# Patient Record
Sex: Female | Born: 1960 | Race: Asian | Hispanic: No | Marital: Single | Smoking: Never smoker
Health system: Southern US, Community
[De-identification: ages and names within clinical notes are randomized; demographics above are authoritative.]

## PROBLEM LIST (undated history)

## (undated) DIAGNOSIS — K759 Inflammatory liver disease, unspecified: Secondary | ICD-10-CM

## (undated) HISTORY — PX: DIAGNOSTIC LAPAROSCOPY: SUR761

---

## 2002-11-10 HISTORY — PX: LAPAROSCOPIC SALPINGOOPHERECTOMY: SUR795

## 2004-08-21 ENCOUNTER — Emergency Department: Payer: Self-pay | Admitting: Emergency Medicine

## 2004-09-06 ENCOUNTER — Ambulatory Visit: Payer: Self-pay | Admitting: Obstetrics and Gynecology

## 2004-09-22 ENCOUNTER — Emergency Department: Payer: Self-pay | Admitting: Emergency Medicine

## 2004-11-10 ENCOUNTER — Inpatient Hospital Stay: Payer: Self-pay | Admitting: Unknown Physician Specialty

## 2005-10-10 ENCOUNTER — Emergency Department: Payer: Self-pay | Admitting: Emergency Medicine

## 2005-12-17 ENCOUNTER — Ambulatory Visit: Payer: Self-pay | Admitting: Obstetrics and Gynecology

## 2006-01-19 ENCOUNTER — Inpatient Hospital Stay: Payer: Self-pay | Admitting: Obstetrics and Gynecology

## 2007-04-29 ENCOUNTER — Emergency Department: Payer: Self-pay | Admitting: Emergency Medicine

## 2007-07-29 ENCOUNTER — Ambulatory Visit: Payer: Self-pay | Admitting: Obstetrics and Gynecology

## 2008-08-01 ENCOUNTER — Ambulatory Visit: Payer: Self-pay | Admitting: Obstetrics and Gynecology

## 2008-10-30 ENCOUNTER — Ambulatory Visit: Payer: Self-pay | Admitting: Gastroenterology

## 2008-11-21 ENCOUNTER — Ambulatory Visit: Payer: Self-pay | Admitting: Obstetrics and Gynecology

## 2008-11-24 ENCOUNTER — Inpatient Hospital Stay: Payer: Self-pay | Admitting: Obstetrics and Gynecology

## 2008-12-29 ENCOUNTER — Ambulatory Visit: Payer: Self-pay | Admitting: Gastroenterology

## 2009-04-24 ENCOUNTER — Emergency Department: Payer: Self-pay | Admitting: Emergency Medicine

## 2009-05-10 ENCOUNTER — Ambulatory Visit: Payer: Self-pay | Admitting: Internal Medicine

## 2009-05-18 ENCOUNTER — Ambulatory Visit: Payer: Self-pay | Admitting: Internal Medicine

## 2009-06-10 ENCOUNTER — Ambulatory Visit: Payer: Self-pay | Admitting: Internal Medicine

## 2010-09-25 ENCOUNTER — Ambulatory Visit: Payer: Self-pay | Admitting: Obstetrics and Gynecology

## 2010-10-04 ENCOUNTER — Emergency Department: Payer: Self-pay | Admitting: Emergency Medicine

## 2012-02-25 ENCOUNTER — Emergency Department: Payer: Self-pay | Admitting: *Deleted

## 2014-03-16 ENCOUNTER — Ambulatory Visit: Payer: Self-pay | Admitting: Obstetrics and Gynecology

## 2015-03-14 ENCOUNTER — Other Ambulatory Visit: Payer: Self-pay | Admitting: Obstetrics and Gynecology

## 2015-03-14 DIAGNOSIS — Z1231 Encounter for screening mammogram for malignant neoplasm of breast: Secondary | ICD-10-CM

## 2015-03-29 ENCOUNTER — Ambulatory Visit
Admission: RE | Admit: 2015-03-29 | Discharge: 2015-03-29 | Disposition: A | Discharge status: Home / Self Care | Payer: Managed Care, Other (non HMO) | Source: Ambulatory Visit | Attending: Obstetrics and Gynecology | Admitting: Obstetrics and Gynecology

## 2015-03-29 DIAGNOSIS — Z1231 Encounter for screening mammogram for malignant neoplasm of breast: Secondary | ICD-10-CM | POA: Insufficient documentation

## 2016-03-17 ENCOUNTER — Other Ambulatory Visit: Payer: Self-pay | Admitting: Obstetrics and Gynecology

## 2016-03-17 DIAGNOSIS — Z1231 Encounter for screening mammogram for malignant neoplasm of breast: Secondary | ICD-10-CM

## 2016-03-31 ENCOUNTER — Ambulatory Visit
Admission: RE | Admit: 2016-03-31 | Discharge: 2016-03-31 | Disposition: A | Discharge status: Home / Self Care | Payer: Managed Care, Other (non HMO) | Source: Ambulatory Visit | Attending: Obstetrics and Gynecology | Admitting: Obstetrics and Gynecology

## 2016-03-31 DIAGNOSIS — Z1231 Encounter for screening mammogram for malignant neoplasm of breast: Secondary | ICD-10-CM | POA: Diagnosis not present

## 2016-08-07 ENCOUNTER — Encounter: Payer: Self-pay | Admitting: *Deleted

## 2016-08-08 ENCOUNTER — Encounter: Payer: Self-pay | Admitting: *Deleted

## 2016-08-08 ENCOUNTER — Ambulatory Visit: Payer: Managed Care, Other (non HMO) | Admitting: Anesthesiology

## 2016-08-08 ENCOUNTER — Encounter
Admission: RE | Disposition: A | Discharge status: Home / Self Care | Payer: Self-pay | Source: Ambulatory Visit | Attending: Gastroenterology

## 2016-08-08 ENCOUNTER — Ambulatory Visit
Admission: RE | Admit: 2016-08-08 | Discharge: 2016-08-08 | Disposition: A | Discharge status: Home / Self Care | Payer: Managed Care, Other (non HMO) | Source: Ambulatory Visit | Attending: Gastroenterology | Admitting: Gastroenterology

## 2016-08-08 DIAGNOSIS — Z87891 Personal history of nicotine dependence: Secondary | ICD-10-CM | POA: Diagnosis not present

## 2016-08-08 DIAGNOSIS — Z1211 Encounter for screening for malignant neoplasm of colon: Secondary | ICD-10-CM | POA: Insufficient documentation

## 2016-08-08 DIAGNOSIS — K5730 Diverticulosis of large intestine without perforation or abscess without bleeding: Secondary | ICD-10-CM | POA: Insufficient documentation

## 2016-08-08 DIAGNOSIS — Z79899 Other long term (current) drug therapy: Secondary | ICD-10-CM | POA: Insufficient documentation

## 2016-08-08 DIAGNOSIS — K640 First degree hemorrhoids: Secondary | ICD-10-CM | POA: Insufficient documentation

## 2016-08-08 HISTORY — DX: Inflammatory liver disease, unspecified: K75.9

## 2016-08-08 HISTORY — PX: COLONOSCOPY WITH PROPOFOL: SHX5780

## 2016-08-08 SURGERY — COLONOSCOPY WITH PROPOFOL
Anesthesia: General

## 2016-08-08 MED ORDER — LIDOCAINE HCL (CARDIAC) 20 MG/ML IV SOLN
INTRAVENOUS | Status: DC | PRN
  Administered 2016-08-08: 60 mg via INTRAVENOUS

## 2016-08-08 MED ORDER — SODIUM CHLORIDE 0.9 % IV SOLN: INTRAVENOUS | Status: DC

## 2016-08-08 MED ORDER — PROPOFOL 500 MG/50ML IV EMUL
INTRAVENOUS | Status: DC | PRN
  Administered 2016-08-08: 140 ug/kg/min via INTRAVENOUS

## 2016-08-08 MED ORDER — PROPOFOL 10 MG/ML IV BOLUS
INTRAVENOUS | Status: DC | PRN
  Administered 2016-08-08: 40 mg via INTRAVENOUS

## 2016-08-08 MED ORDER — SODIUM CHLORIDE 0.9 % IV SOLN
INTRAVENOUS | Status: DC
  Administered 2016-08-08: 14:00:00 via INTRAVENOUS
  Administered 2016-08-08: 1000 mL via INTRAVENOUS

## 2016-08-08 NOTE — H&P (Signed)
Outpatient short stay form Pre-procedure 08/08/2016 2:14 PM Christena DeemMartin U Skulskie MD  Primary Physician: Dr. Jennell Cornerhomas Schermerhorn  Reason for visit:  Screening colonoscopy  History of present illness:  Patient is a 55 year old female presenting today as above. She tolerated her prep well. She takes no aspirin or blood thinning agents.    Current Facility-Administered Medications:  .  0.9 %  sodium chloride infusion, , Intravenous, Continuous, Christena DeemMartin U Skulskie, MD .  0.9 %  sodium chloride infusion, , Intravenous, Continuous, Christena DeemMartin U Skulskie, MD  Prescriptions Prior to Admission  Medication Sig Dispense Refill Last Dose  . b complex vitamins capsule Take 1 capsule by mouth daily.   Past Week at Unknown time  . Cholecalciferol 1000 units capsule Take 1,000 Units by mouth daily.   Past Week at Unknown time  . ferrous sulfate 325 (65 FE) MG tablet Take 325 mg by mouth daily with breakfast.   Past Week at Unknown time  . Multiple Vitamins-Minerals (ONE-A-DAY WOMENS 50 PLUS PO) Take by mouth daily.   Past Week at Unknown time     No Known Allergies   Past Medical History:  Diagnosis Date  . Hepatitis     Review of systems:      Physical Exam    Heart and lungs: Regular rate and rhythm without rub or gallop, lungs are bilaterally clear.    HEENT: Normocephalic atraumatic eyes are anicteric    Other:     Pertinant exam for procedure: Soft nontender nondistended bowel sounds positive normoactive.    Planned proceedures: Colonoscopy and indicated procedures. I have discussed the risks benefits and complications of procedures to include not limited to bleeding, infection, perforation and the risk of sedation and the patient wishes to proceed.    Christena DeemMartin U Skulskie, MD Gastroenterology 08/08/2016  2:14 PM

## 2016-08-08 NOTE — Anesthesia Preprocedure Evaluation (Signed)
Anesthesia Evaluation  Patient identified by MRN, date of birth, ID band Patient awake    Reviewed: Allergy & Precautions, NPO status , Patient's Chart, lab work & pertinent test results  Airway Mallampati: II       Dental no notable dental hx.    Pulmonary former smoker,    Pulmonary exam normal        Cardiovascular negative cardio ROS Normal cardiovascular exam     Neuro/Psych negative neurological ROS  negative psych ROS   GI/Hepatic negative GI ROS, (+) Hepatitis -, Unspecified  Endo/Other  negative endocrine ROS  Renal/GU negative Renal ROS  negative genitourinary   Musculoskeletal negative musculoskeletal ROS (+)   Abdominal Normal abdominal exam  (+)   Peds negative pediatric ROS (+)  Hematology negative hematology ROS (+)   Anesthesia Other Findings   Reproductive/Obstetrics                             Anesthesia Physical Anesthesia Plan  ASA: II  Anesthesia Plan: General   Post-op Pain Management:    Induction: Intravenous  Airway Management Planned: Nasal Cannula  Additional Equipment:   Intra-op Plan:   Post-operative Plan:   Informed Consent: I have reviewed the patients History and Physical, chart, labs and discussed the procedure including the risks, benefits and alternatives for the proposed anesthesia with the patient or authorized representative who has indicated his/her understanding and acceptance.   Dental advisory given  Plan Discussed with: CRNA and Surgeon  Anesthesia Plan Comments:         Anesthesia Quick Evaluation

## 2016-08-08 NOTE — Op Note (Signed)
[facility] Gastroenterology Patient Name: Melanie Alexander Procedure Date: 08/08/2016 2:19 PM MRN: [id number] Account #: [id number] Date of Birth: 1961-08-04 Admit Type: Outpatient Age: 55 Room: [facility] ENDO ROOM 4 Gender: Female Note Status: Finalized Procedure:            Colonoscopy Indications:          Screening for colorectal malignant neoplasm, This is                        the patient's first colonoscopy Providers:            Christena Deem, MD Referring MD:         Suzy Bouchard, MD (Referring MD) Medicines:            Monitored Anesthesia Care Complications:        No immediate complications. Procedure:            Pre-Anesthesia Assessment:                       - ASA Grade Assessment: II - A patient with mild                        systemic disease.                       After obtaining informed consent, the colonoscope was                        passed under direct vision. Throughout the procedure,                        the patient's blood pressure, pulse, and oxygen                        saturations were monitored continuously. The                        Colonoscope was introduced through the anus and                        advanced to the the cecum, identified by appendiceal                        orifice and ileocecal valve. The colonoscopy was                        performed with moderate difficulty due to a tortuous                        colon. Successful completion of the procedure was aided                        by changing the patient to a supine position and using                        manual pressure. The patient tolerated the procedure                        well. The quality of the bowel preparation was good. Findings:      A few small-mouthed diverticula were  found in the sigmoid colon and       descending colon.      The exam was otherwise normal throughout the examined colon.      Non-bleeding internal hemorrhoids were  found during anoscopy. The       hemorrhoids were small and Grade I (internal hemorrhoids that do not       prolapse).      The digital rectal exam was normal. Impression:           - Diverticulosis in the sigmoid colon and in the                        descending colon.                       - Non-bleeding internal hemorrhoids.                       - No specimens collected. Recommendation:       - Discharge patient to home.                       - Repeat colonoscopy in 10 years for screening purposes. Procedure Code(s):    --- Professional ---                       [phone] [id number], Colonoscopy, flexible; diagnostic, including                        collection of specimen(s) by brushing or washing, when                        performed (separate procedure) Diagnosis Code(s):    --- Professional ---                       Z12.11, Encounter for screening for malignant neoplasm                        of colon                       K64.0, First degree hemorrhoids                       K57.30, Diverticulosis of large intestine without                        perforation or abscess without bleeding CPT copyright 2016 American Medical Association. All rights reserved. The codes documented in this report are preliminary and upon coder review may  be revised to meet current compliance requirements. Christena DeemMartin U Dealie Koelzer, MD 08/08/2016 2:45:20 PM This report has been signed electronically. Number of Addenda: 0 Note Initiated On: 08/08/2016 2:19 PM Scope Withdrawal Time: 0 hours 5 minutes 20 seconds  Total Procedure Duration: 0 hours 17 minutes 9 seconds       [facility]

## 2016-08-08 NOTE — Transfer of Care (Signed)
Immediate Anesthesia Transfer of Care Note  Patient: Melanie Alexander  Procedure(s) Performed: Procedure(s): COLONOSCOPY WITH PROPOFOL (N/A)  Patient Location: Endoscopy Unit  Anesthesia Type:General  Level of Consciousness: awake, alert , oriented and patient cooperative  Airway & Oxygen Therapy: Patient Spontanous Breathing and Patient connected to nasal cannula oxygen  Post-op Assessment: Report given to RN, Post -op Vital signs reviewed and stable and Patient moving all extremities X 4  Post vital signs: Reviewed and stable  Last Vitals:  Vitals:   08/08/16 1358  BP: 122/76  Pulse: 74  Resp: 16  Temp: 36.7 C    Last Pain:  Vitals:   08/08/16 1358  TempSrc: Tympanic         Complications: No apparent anesthesia complications

## 2016-08-08 NOTE — Anesthesia Postprocedure Evaluation (Signed)
Anesthesia Post Note  Patient: Melanie Alexander  Procedure(s) Performed: Procedure(s) (LRB): COLONOSCOPY WITH PROPOFOL (N/A)  Patient location during evaluation: PACU Anesthesia Type: General Level of consciousness: awake and alert and oriented Pain management: pain level controlled Vital Signs Assessment: post-procedure vital signs reviewed and stable Respiratory status: spontaneous breathing Cardiovascular status: blood pressure returned to baseline Anesthetic complications: no    Last Vitals:  Vitals:   08/08/16 1505 08/08/16 1515  BP: 131/80 (!) 144/83  Pulse: 65 61  Resp: 15 15  Temp:      Last Pain:  Vitals:   08/08/16 1445  TempSrc: Oral                 Lalita Ebel

## 2016-08-11 ENCOUNTER — Encounter: Payer: Self-pay | Admitting: Gastroenterology

## 2017-03-02 ENCOUNTER — Other Ambulatory Visit: Payer: Self-pay | Admitting: Obstetrics and Gynecology

## 2017-03-02 DIAGNOSIS — Z1231 Encounter for screening mammogram for malignant neoplasm of breast: Secondary | ICD-10-CM

## 2017-04-02 ENCOUNTER — Ambulatory Visit
Admission: RE | Admit: 2017-04-02 | Discharge: 2017-04-02 | Disposition: A | Discharge status: Home / Self Care | Payer: Commercial Managed Care - PPO | Source: Ambulatory Visit | Attending: Obstetrics and Gynecology | Admitting: Obstetrics and Gynecology

## 2017-04-02 DIAGNOSIS — Z1231 Encounter for screening mammogram for malignant neoplasm of breast: Secondary | ICD-10-CM | POA: Insufficient documentation

## 2018-01-13 ENCOUNTER — Emergency Department: Payer: Commercial Managed Care - PPO

## 2018-01-13 ENCOUNTER — Encounter: Payer: Self-pay | Admitting: Emergency Medicine

## 2018-01-13 ENCOUNTER — Emergency Department
Admission: EM | Admit: 2018-01-13 | Discharge: 2018-01-13 | Disposition: A | Discharge status: Home / Self Care | Payer: Commercial Managed Care - PPO | Attending: Emergency Medicine | Admitting: Emergency Medicine

## 2018-01-13 DIAGNOSIS — W0110XA Fall on same level from slipping, tripping and stumbling with subsequent striking against unspecified object, initial encounter: Secondary | ICD-10-CM | POA: Insufficient documentation

## 2018-01-13 DIAGNOSIS — Y929 Unspecified place or not applicable: Secondary | ICD-10-CM | POA: Insufficient documentation

## 2018-01-13 DIAGNOSIS — S92421A Displaced fracture of distal phalanx of right great toe, initial encounter for closed fracture: Secondary | ICD-10-CM | POA: Diagnosis not present

## 2018-01-13 DIAGNOSIS — Z87891 Personal history of nicotine dependence: Secondary | ICD-10-CM | POA: Insufficient documentation

## 2018-01-13 DIAGNOSIS — Y939 Activity, unspecified: Secondary | ICD-10-CM | POA: Insufficient documentation

## 2018-01-13 DIAGNOSIS — S99921A Unspecified injury of right foot, initial encounter: Secondary | ICD-10-CM | POA: Diagnosis present

## 2018-01-13 DIAGNOSIS — Z79899 Other long term (current) drug therapy: Secondary | ICD-10-CM | POA: Insufficient documentation

## 2018-01-13 DIAGNOSIS — Y999 Unspecified external cause status: Secondary | ICD-10-CM | POA: Insufficient documentation

## 2018-01-13 DIAGNOSIS — S92401A Displaced unspecified fracture of right great toe, initial encounter for closed fracture: Secondary | ICD-10-CM

## 2018-01-13 MED ORDER — IBUPROFEN 600 MG PO TABS
600.0000 mg | ORAL_TABLET | Freq: Once | ORAL | Status: AC
  Administered 2018-01-13: 600 mg via ORAL
  Filled 2018-01-13: qty 1

## 2018-01-13 MED ORDER — IBUPROFEN 600 MG PO TABS: 600.0000 mg | ORAL_TABLET | Freq: Four times a day (QID) | ORAL | 0 refills | Status: AC | PRN

## 2018-01-13 MED ORDER — OXYCODONE-ACETAMINOPHEN 5-325 MG PO TABS: 1.0000 | ORAL_TABLET | Freq: Four times a day (QID) | ORAL | 0 refills | Status: AC | PRN

## 2018-01-13 MED ORDER — OXYCODONE-ACETAMINOPHEN 5-325 MG PO TABS
1.0000 | ORAL_TABLET | Freq: Once | ORAL | Status: AC
  Administered 2018-01-13: 1 via ORAL
  Filled 2018-01-13: qty 1

## 2018-01-13 NOTE — Discharge Instructions (Signed)
Wear open shoe for 5-7 days as needed.  Buddy tape toe for 10 days.

## 2018-01-13 NOTE — ED Provider Notes (Signed)
[facility] Emergency Department Provider Note   ____________________________________________   First MD Initiated Contact with Patient 01/13/18 1737     (approximate)  I have reviewed the triage vital signs and the nursing notes.   HISTORY  Chief Complaint Foot Pain    HPI Melanie Alexander is a 57 y.o. female patient complain of right great toe pain.  Patient states she fell and stepped her toe this morning.  Patient rates the pain as a 10/10.  Patient described the pain is "achy/sore".  No palliative measures prior to arrival.  Past Medical History:  Diagnosis Date  . Hepatitis     There are no active problems to display for this patient.   Past Surgical History:  Procedure Laterality Date  . COLONOSCOPY WITH PROPOFOL N/A 08/08/2016   Procedure: COLONOSCOPY WITH PROPOFOL;  Surgeon: Christena DeemMartin U Skulskie, MD;  Location: [facility] ENDOSCOPY;  Service: Endoscopy;  Laterality: N/A;  . DIAGNOSTIC LAPAROSCOPY    . LAPAROSCOPIC SALPINGOOPHERECTOMY Left 2004    Prior to Admission medications   Medication Sig Start Date End Date Taking? Authorizing Provider  b complex vitamins capsule Take 1 capsule by mouth daily.    [provider]  Cholecalciferol 1000 units capsule Take 1,000 Units by mouth daily.    [provider]  ferrous sulfate 325 (65 FE) MG tablet Take 325 mg by mouth daily with breakfast.    [provider]  ibuprofen (ADVIL,MOTRIN) 600 MG tablet Take 1 tablet (600 mg total) by mouth every 6 (six) hours as needed. 01/13/18   Joni ReiningSmith, Javayah Magaw K, PA-C  Multiple Vitamins-Minerals (ONE-A-DAY WOMENS 50 PLUS PO) Take by mouth daily.    [provider]  oxyCODONE-acetaminophen (PERCOCET) 5-325 MG tablet Take 1 tablet by mouth every 6 (six) hours as needed for severe pain. 01/13/18   Joni ReiningSmith, Maripaz Mullan K, PA-C    Allergies Patient has no known allergies.  Family History  Problem Relation Age of Onset  . Breast cancer Neg Hx      Social History Social History   Tobacco Use  . Smoking status: Former Games developermoker  . Smokeless tobacco: Never Used  Substance Use Topics  . Alcohol use: No  . Drug use: No    Review of Systems Constitutional: No fever/chills Eyes: No visual changes. ENT: No sore throat. Cardiovascular: Denies chest pain. Respiratory: Denies shortness of breath. Gastrointestinal: No abdominal pain.  No nausea, no vomiting.  No diarrhea.  No constipation. Genitourinary: Negative for dysuria. Musculoskeletal: Right great toe pain. Skin: Negative for rash. Neurological: Negative for headaches, focal weakness or numbness.   ____________________________________________   PHYSICAL EXAM:  VITAL SIGNS: ED Triage Vitals [01/13/18 1728]  Enc Vitals Group     BP      Pulse      Resp      Temp      Temp src      SpO2      Weight 123 lb (55.8 kg)     Height 5\' 1"  (1.549 m)     Head Circumference      Peak Flow      Pain Score 10     Pain Loc      Pain Edu?      Excl. in GC?    Constitutional: Alert and oriented. Well appearing and in no acute distress. Neck: No stridor.  No cervical spine tenderness to palpation. Hematological/Lymphatic/Immunilogical: No cervical lymphadenopathy. Cardiovascular: Normal rate, regular rhythm. Grossly normal heart sounds.  Good peripheral circulation.  Respiratory: Normal respiratory effort.  No retractions. Lungs CTAB. Musculoskeletal: No deformity to the great toe.  Mild edema.  Moderate guarding palpation of the proximal phalange. Neurologic:  Normal speech and language. No gross focal neurologic deficits are appreciated. No gait instability. Skin:  Skin is warm, dry and intact. No rash noted.  Ecchymosis dorsal aspect right great toe. Psychiatric: Mood and affect are normal. Speech and behavior are normal.  ____________________________________________   LABS (all labs ordered are listed, but only abnormal results are displayed)  Labs Reviewed - No  data to display ____________________________________________  EKG   ____________________________________________  RADIOLOGY  ED MD interpretation: Nondisplaced fracture distal phalanx first digit right foot  Official radiology report(s): Dg Toe Great Right  Result Date: 01/13/2018 CLINICAL DATA:  Edema secondary to trauma EXAM: RIGHT GREAT TOE COMPARISON:  None. FINDINGS: Possible small nondisplaced intra-articular fracture at the base of the first distal phalanx. No subluxation. Mild degenerative changes at the first MTP joint. IMPRESSION: Possible small nondisplaced intra-articular fracture at the base of the first distal phalanx Electronically Signed   By: Jasmine Pang M.D.   On: 01/13/2018 19:05    ____________________________________________   PROCEDURES  Procedure(s) performed: None  Procedures  Critical Care performed: No  ____________________________________________   INITIAL IMPRESSION / ASSESSMENT AND PLAN / ED COURSE  As part of my medical decision making, I reviewed the following data within the electronic MEDICAL RECORD NUMBER    Nondisplaced fracture of the right toe.  Discussed x-ray findings with patient.  Patient toe was buddy taped and she was placed in a open shoe.  Patient given discharge care instructions and a work note.  Patient advised to follow orthopedics in 1 week if pain persists.      ____________________________________________   FINAL CLINICAL IMPRESSION(S) / ED DIAGNOSES  Final diagnoses:  Closed displaced fracture of phalanx of right great toe, unspecified phalanx, initial encounter     ED Discharge Orders        Ordered    oxyCODONE-acetaminophen (PERCOCET) 5-325 MG tablet  Every 6 hours PRN     01/13/18 1913    ibuprofen (ADVIL,MOTRIN) 600 MG tablet  Every 6 hours PRN     01/13/18 1913       Note:  This document was prepared using Dragon voice recognition software and may include unintentional dictation errors.    Joni Reining, PA-C 01/13/18 1915    Emily Filbert, MD 01/14/18 [phone]

## 2018-01-13 NOTE — ED Triage Notes (Signed)
Pt to ED with c/o of right foot pain. Bruising noted to right big toe. Pt states she fell this morning.

## 2018-03-03 ENCOUNTER — Other Ambulatory Visit: Payer: Self-pay | Admitting: Obstetrics and Gynecology

## 2018-03-03 DIAGNOSIS — Z1231 Encounter for screening mammogram for malignant neoplasm of breast: Secondary | ICD-10-CM

## 2018-11-05 ENCOUNTER — Ambulatory Visit
Admission: RE | Admit: 2018-11-05 | Discharge: 2018-11-05 | Disposition: A | Discharge status: Home / Self Care | Payer: Commercial Managed Care - PPO | Source: Ambulatory Visit | Attending: Obstetrics and Gynecology | Admitting: Obstetrics and Gynecology

## 2018-11-05 DIAGNOSIS — Z1231 Encounter for screening mammogram for malignant neoplasm of breast: Secondary | ICD-10-CM | POA: Insufficient documentation

## 2018-12-09 DIAGNOSIS — H6121 Impacted cerumen, right ear: Secondary | ICD-10-CM | POA: Diagnosis not present

## 2018-12-09 DIAGNOSIS — J301 Allergic rhinitis due to pollen: Secondary | ICD-10-CM | POA: Diagnosis not present

## 2018-12-09 DIAGNOSIS — J340 Abscess, furuncle and carbuncle of nose: Secondary | ICD-10-CM | POA: Diagnosis not present

## 2018-12-24 DIAGNOSIS — H35371 Puckering of macula, right eye: Secondary | ICD-10-CM | POA: Diagnosis not present

## 2018-12-24 DIAGNOSIS — M3501 Sicca syndrome with keratoconjunctivitis: Secondary | ICD-10-CM | POA: Diagnosis not present

## 2019-01-02 DIAGNOSIS — R51 Headache: Secondary | ICD-10-CM | POA: Diagnosis not present

## 2019-01-02 DIAGNOSIS — R58 Hemorrhage, not elsewhere classified: Secondary | ICD-10-CM | POA: Diagnosis not present

## 2019-01-02 DIAGNOSIS — R404 Transient alteration of awareness: Secondary | ICD-10-CM | POA: Diagnosis not present

## 2019-10-04 ENCOUNTER — Other Ambulatory Visit: Payer: Self-pay | Admitting: Obstetrics and Gynecology

## 2019-10-04 DIAGNOSIS — Z1231 Encounter for screening mammogram for malignant neoplasm of breast: Secondary | ICD-10-CM

## 2019-11-08 ENCOUNTER — Ambulatory Visit
Admission: RE | Admit: 2019-11-08 | Discharge: 2019-11-08 | Disposition: A | Discharge status: Home / Self Care | Payer: Commercial Managed Care - PPO | Source: Ambulatory Visit | Attending: Obstetrics and Gynecology | Admitting: Obstetrics and Gynecology

## 2019-11-08 DIAGNOSIS — Z1231 Encounter for screening mammogram for malignant neoplasm of breast: Secondary | ICD-10-CM | POA: Diagnosis not present

## 2020-02-09 ENCOUNTER — Ambulatory Visit: Payer: Commercial Managed Care - PPO | Attending: Internal Medicine

## 2020-02-09 DIAGNOSIS — Z23 Encounter for immunization: Secondary | ICD-10-CM

## 2020-02-09 NOTE — Progress Notes (Signed)
   Covid-19 Vaccination Clinic  Name:  Melanie Alexander    MRN: [id number] DOB: 1961-09-01  02/09/2020  Melanie Alexander was observed post Covid-19 immunization for 15 minutes without incident. She was provided with Vaccine Information Sheet and instruction to access the V-Safe system.   Melanie Alexander was instructed to call 911 with any severe reactions post vaccine: Marland Kitchen Difficulty breathing  . Swelling of face and throat  . A fast heartbeat  . A bad rash all over body  . Dizziness and weakness   Immunizations Administered    Name Date Dose VIS Date Route   Pfizer COVID-19 Vaccine 02/09/2020  4:33 PM 0.3 mL 10/21/2019 Intramuscular   Manufacturer: [employer], [employer]   Lot: [id number]   NDC: [address]-6

## 2020-02-28 IMAGING — MG DIGITAL SCREENING BILAT W/ TOMO W/ CAD
8 series · 8 of 24 positions shown · non-contrast
Comparison: Previous exam(s).

CLINICAL DATA: Screening.

EXAM:
DIGITAL SCREENING BILATERAL MAMMOGRAM WITH TOMO AND CAD

[R CC synth-2D]
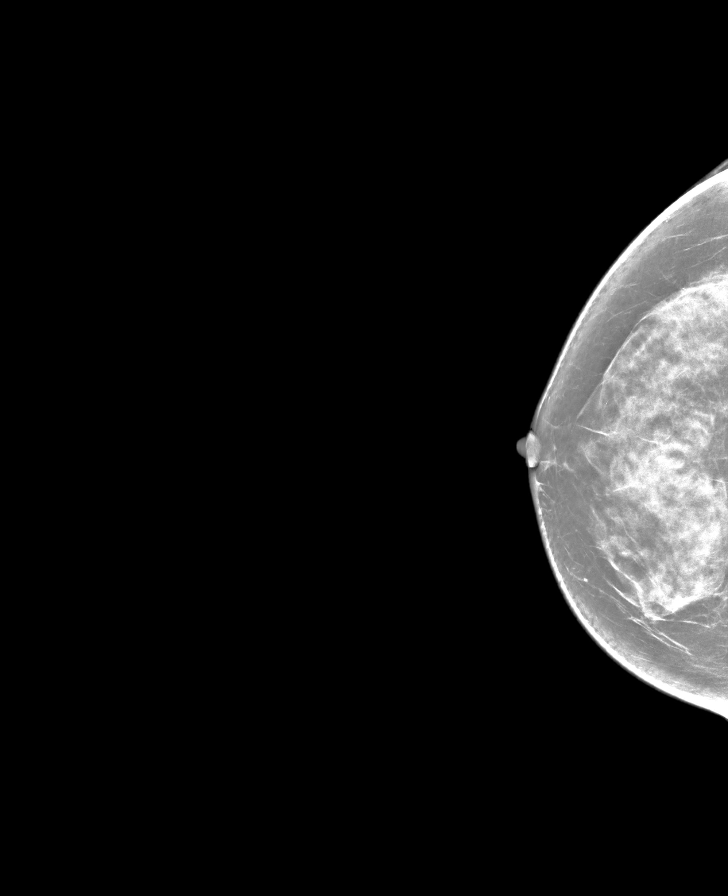

[L CC synth-2D]
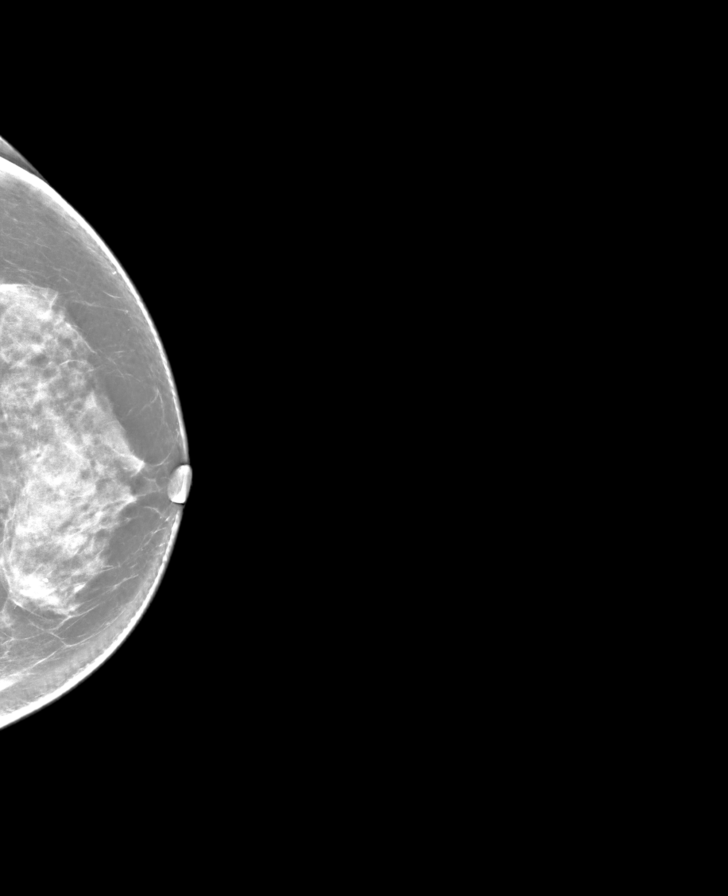

[R MLO synth-2D]
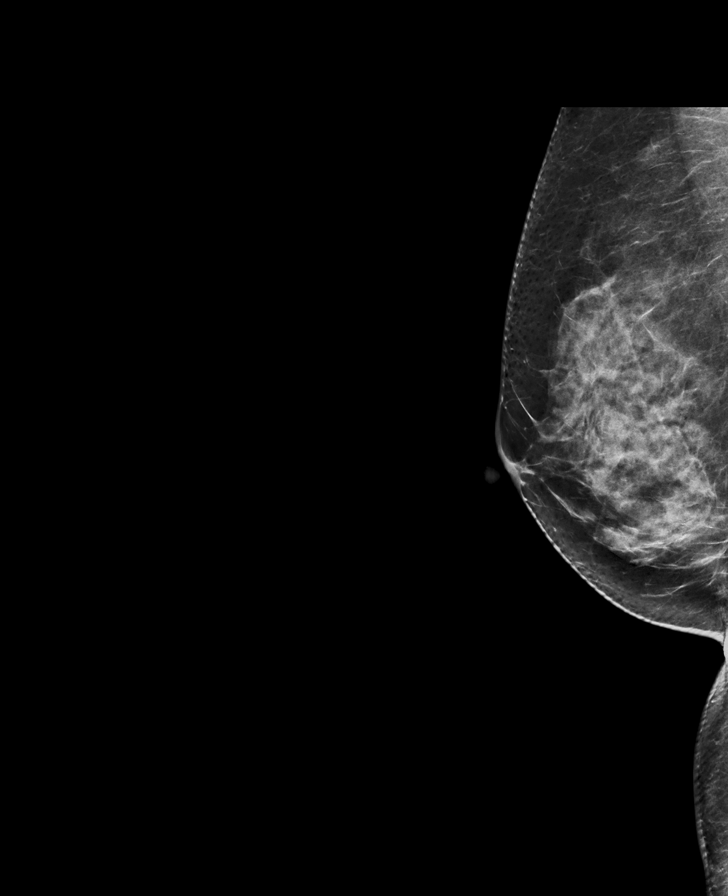

[L MLO synth-2D]
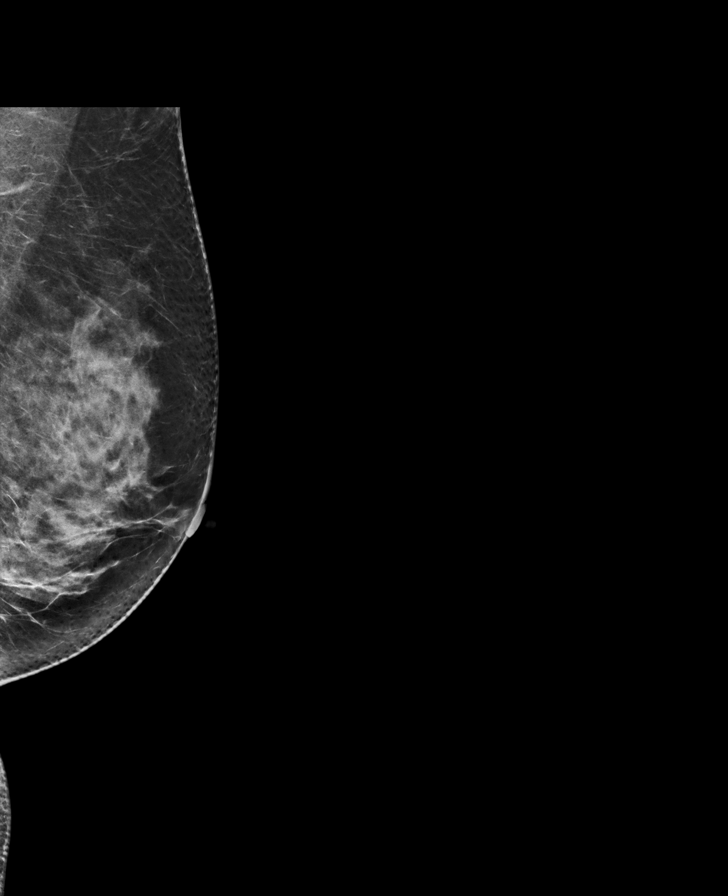

[L MLO tomo · tomo slice 39/77.0]
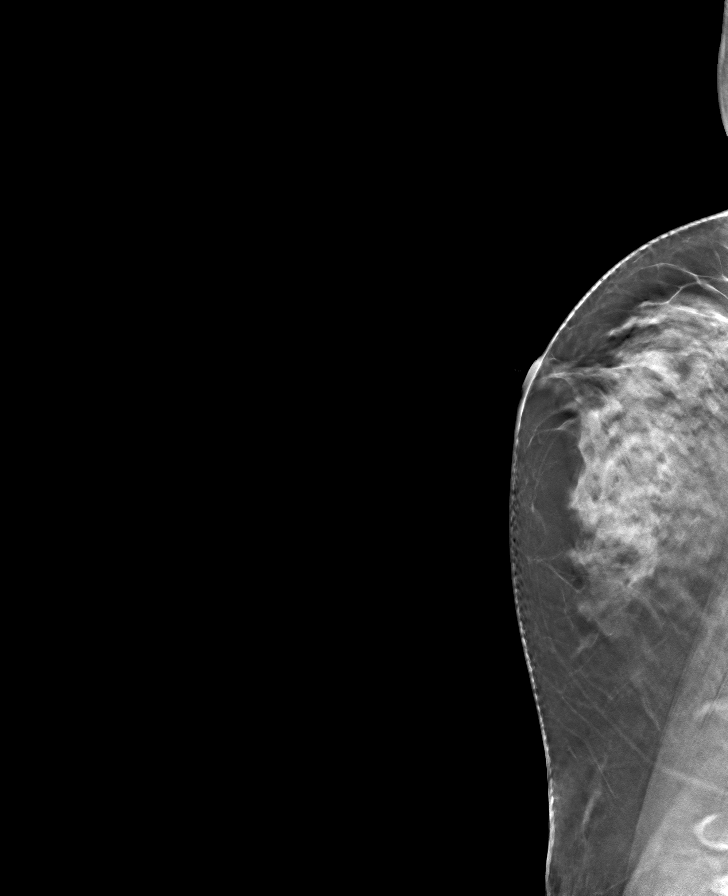

[R CC tomo · tomo slice 39/76.0]
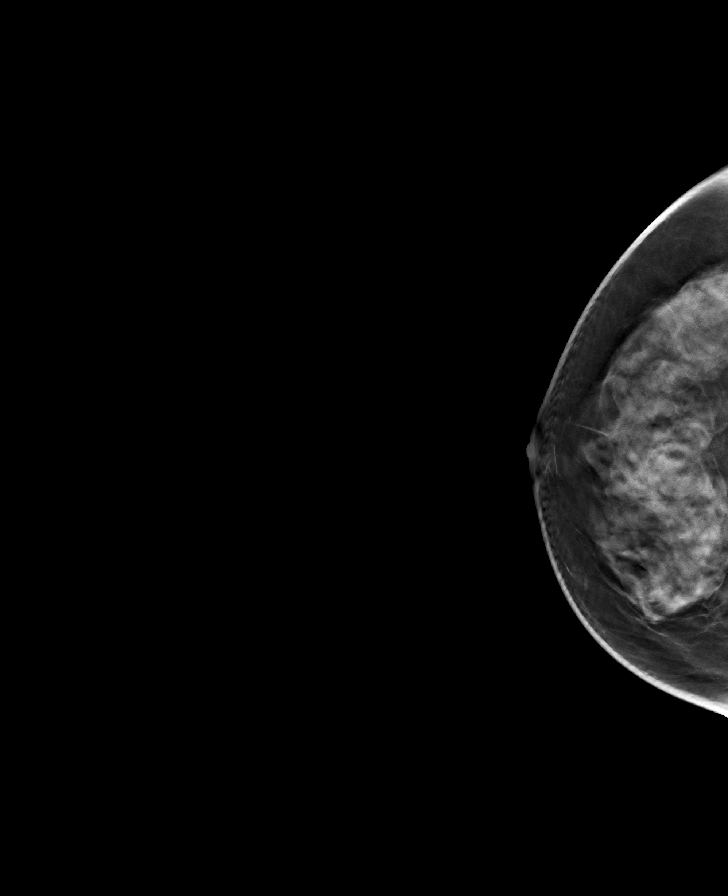

[R MLO tomo · tomo slice 37/73.0]
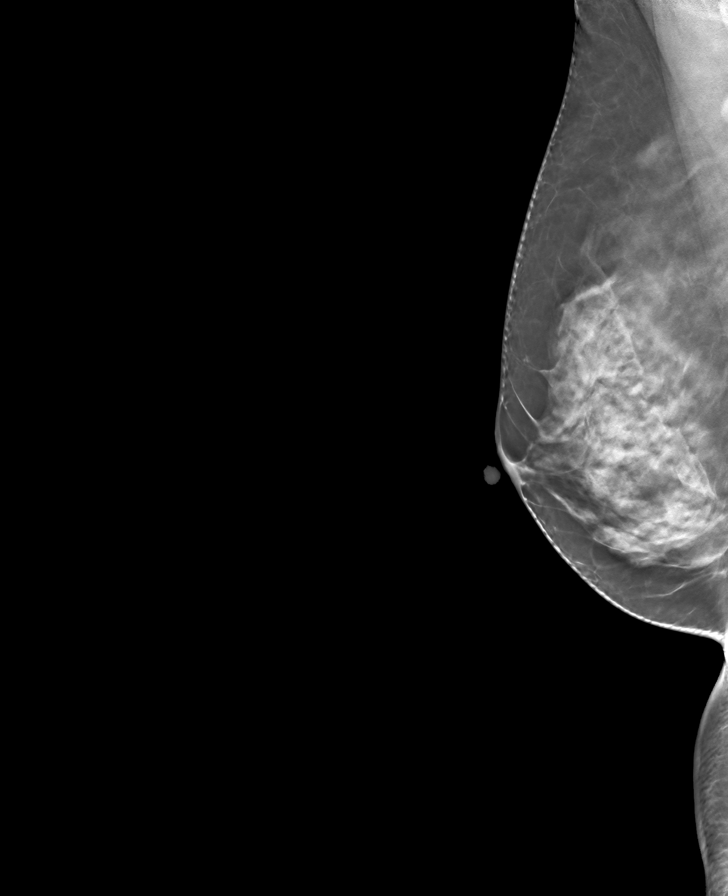

[L CC tomo · tomo slice 39/78.0]
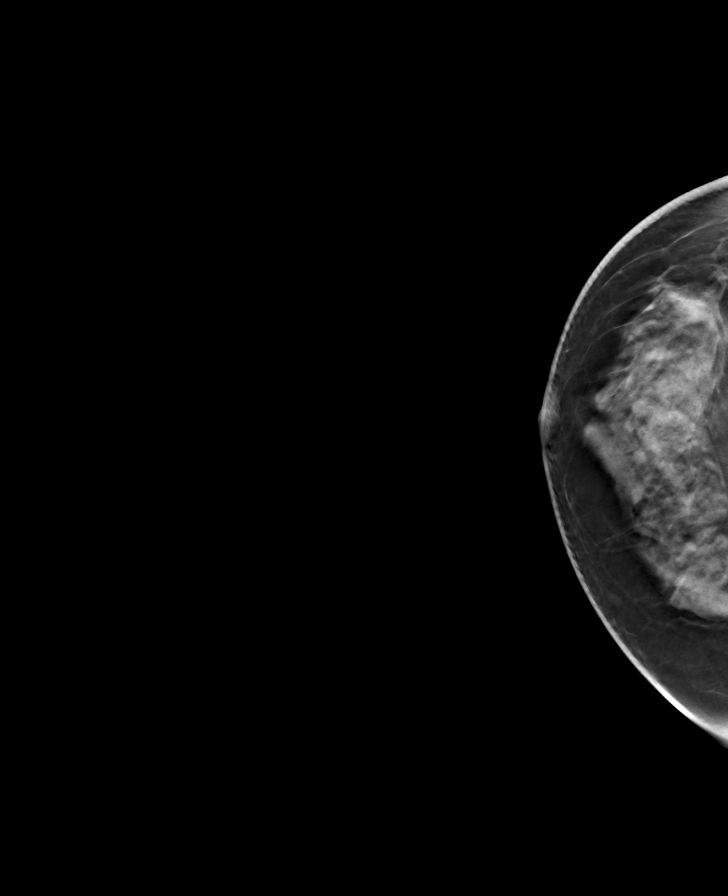

[8 of 24 positions shown; findings below may reference images not displayed]

ACR Breast Density Category d: The breast tissue is extremely dense,
which lowers the sensitivity of mammography
FINDINGS: There are no findings suspicious for malignancy. Images were
processed with CAD.
IMPRESSION: No mammographic evidence of malignancy. A result letter of this
screening mammogram will be mailed directly to the patient.

RECOMMENDATION:
Screening mammogram in one year. (Code:WO-0-ZI0)

BI-RADS CATEGORY  1: Negative.

## 2020-03-04 ENCOUNTER — Ambulatory Visit: Payer: Commercial Managed Care - PPO | Attending: Internal Medicine

## 2020-03-04 DIAGNOSIS — Z23 Encounter for immunization: Secondary | ICD-10-CM

## 2020-03-04 NOTE — Progress Notes (Signed)
   Covid-19 Vaccination Clinic  Name:  Melanie Alexander    MRN: [id number] DOB: 1960-12-23  03/04/2020  [name] was observed post Covid-19 immunization for 15 minutes without incident. She was provided with Vaccine Information Sheet and instruction to access the V-Safe system.   [name] was instructed to call 911 with any severe reactions post vaccine: Marland Kitchen Difficulty breathing  . Swelling of face and throat  . A fast heartbeat  . A bad rash all over body  . Dizziness and weakness   Immunizations Administered    Name Date Dose VIS Date Route   Pfizer COVID-19 Vaccine 03/04/2020  3:52 PM 0.3 mL 01/04/2019 Intramuscular   Manufacturer: [employer], [employer]   Lot: [id number]   NDC: [address]-4

## 2020-09-28 ENCOUNTER — Other Ambulatory Visit: Payer: Self-pay | Admitting: Obstetrics and Gynecology

## 2020-09-28 DIAGNOSIS — Z1231 Encounter for screening mammogram for malignant neoplasm of breast: Secondary | ICD-10-CM

## 2020-11-08 ENCOUNTER — Other Ambulatory Visit: Payer: Self-pay

## 2020-11-08 ENCOUNTER — Ambulatory Visit
Admission: RE | Admit: 2020-11-08 | Discharge: 2020-11-08 | Disposition: A | Discharge status: Home / Self Care | Payer: Commercial Managed Care - PPO | Source: Ambulatory Visit | Attending: Obstetrics and Gynecology | Admitting: Obstetrics and Gynecology

## 2020-11-08 DIAGNOSIS — Z1231 Encounter for screening mammogram for malignant neoplasm of breast: Secondary | ICD-10-CM | POA: Diagnosis not present

## 2021-11-29 ENCOUNTER — Emergency Department
Admission: EM | Admit: 2021-11-29 | Discharge: 2021-11-29 | Disposition: A | Discharge status: Home / Self Care | Payer: Commercial Managed Care - PPO | Attending: Emergency Medicine | Admitting: Emergency Medicine

## 2021-11-29 ENCOUNTER — Other Ambulatory Visit: Payer: Self-pay

## 2021-11-29 DIAGNOSIS — R040 Epistaxis: Secondary | ICD-10-CM | POA: Insufficient documentation

## 2021-11-29 MED ORDER — OXYMETAZOLINE HCL 0.05 % NA SOLN
1.0000 | Freq: Once | NASAL | Status: AC
  Administered 2021-11-29: 1 via NASAL
  Filled 2021-11-29: qty 30

## 2021-11-29 NOTE — ED Triage Notes (Signed)
Pt c/o right nare bleeding intermittently since this morning, no blood thinners, bleeding is controlled at present.

## 2021-11-29 NOTE — Discharge Instructions (Signed)
Use Afrin and nasal clamp for recurrent bleeding.  Call ENT clinic Monday morning and tell them you are a follow-up from the emergency room.
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
## 2021-11-29 NOTE — ED Notes (Signed)
Provider at bedside

## 2021-11-29 NOTE — ED Provider Notes (Signed)
°  [facility] EMERGENCY DEPARTMENT Provider Note   CSN: [id number] Arrival date & time: 11/29/21  1344     History { Chief Complaint  Patient presents with   Epistaxis    Melanie Alexander is a 61 y.o. female.  HPI Patient presents with right nasal bleed which started this morning.  Patient states this is a ongoing problem for the past few months with change of weather.  Episodes normally last 5 to 10 minutes but this episode lasted more than 10 minutes.  No active bleeding at this time.  Patient does not take blood thinners.  No other provocative incident for complaint.    Home Medications Prior to Admission medications   Medication Sig Start Date End Date Taking? Authorizing Provider  b complex vitamins capsule Take 1 capsule by mouth daily.    [provider]  Cholecalciferol 1000 units capsule Take 1,000 Units by mouth daily.    [provider]  ferrous sulfate 325 (65 FE) MG tablet Take 325 mg by mouth daily with breakfast.    [provider]  ibuprofen (ADVIL,MOTRIN) 600 MG tablet Take 1 tablet (600 mg total) by mouth every 6 (six) hours as needed. 01/13/18   Joni Reining, PA-C  Multiple Vitamins-Minerals (ONE-A-DAY WOMENS 50 PLUS PO) Take by mouth daily.    [provider]  oxyCODONE-acetaminophen (PERCOCET) 5-325 MG tablet Take 1 tablet by mouth every 6 (six) hours as needed for severe pain. 01/13/18   Joni Reining, PA-C      Allergies    Patient has no known allergies.    Review of Systems   Review of Systems Negative for complaint. Physical Exam Updated Vital Signs BP (!) 143/86 (BP Location: Left Arm)    Pulse (!) 104    Temp 98.5 F (36.9 C) (Oral)    Resp 18    SpO2 99%  Physical Exam No acute distress.  HEENT shows no active bleeding at this time.  There is dry blood in the right nare.  No sinus guarding with palpation.  Neck is supple without lymphadenopathy or bruits.  Lungs clear to auscultation.  Heart  regular rate and rhythm. ED Results / Procedures / Treatments   Labs (all labs ordered are listed, but only abnormal results are displayed) Labs Reviewed - No data to display  EKG None  Radiology No results found.  Procedures Procedures    Medications Ordered in ED Medications  oxymetazoline (AFRIN) 0.05 % nasal spray 1 spray (1 spray Each Nare Given 11/29/21 1720)    ED Course/ Medical Decision Making/ A&P Nasal speculum exam cause some mild bleeding from the right nare.  Afrin was sprayed in the nasal cavity and clamp was applied for 10 minutes.  Bleeding  controlled.                         Medical Decision Making Recurrent epistaxis.  Patient consulted ENT for definitive evaluation and treatment. Final Clinical Impression(s) / ED Diagnoses Final diagnoses:  Right-sided epistaxis    Rx / DC Orders ED Discharge Orders     None         Joni Reining, PA-C 11/29/21 1750    Sharman Cheek, MD 11/29/21 2307

## 2022-01-22 ENCOUNTER — Other Ambulatory Visit: Payer: Self-pay | Admitting: Obstetrics and Gynecology

## 2022-01-22 DIAGNOSIS — Z1231 Encounter for screening mammogram for malignant neoplasm of breast: Secondary | ICD-10-CM

## 2022-03-03 ENCOUNTER — Ambulatory Visit
Admission: RE | Admit: 2022-03-03 | Discharge: 2022-03-03 | Disposition: A | Discharge status: Home / Self Care | Payer: Commercial Managed Care - PPO | Source: Ambulatory Visit | Attending: Obstetrics and Gynecology | Admitting: Obstetrics and Gynecology

## 2022-03-03 DIAGNOSIS — Z1231 Encounter for screening mammogram for malignant neoplasm of breast: Secondary | ICD-10-CM | POA: Insufficient documentation

## 2022-03-04 ENCOUNTER — Other Ambulatory Visit: Payer: Self-pay | Admitting: Physician Assistant

## 2022-03-04 DIAGNOSIS — R413 Other amnesia: Secondary | ICD-10-CM

## 2022-03-18 ENCOUNTER — Other Ambulatory Visit: Payer: Commercial Managed Care - PPO

## 2022-03-25 ENCOUNTER — Ambulatory Visit
Admission: RE | Admit: 2022-03-25 | Discharge: 2022-03-25 | Disposition: A | Discharge status: Home / Self Care | Payer: Commercial Managed Care - PPO | Source: Ambulatory Visit | Attending: Physician Assistant | Admitting: Physician Assistant

## 2022-03-25 DIAGNOSIS — R413 Other amnesia: Secondary | ICD-10-CM

## 2022-03-25 MED ORDER — GADOBENATE DIMEGLUMINE 529 MG/ML IV SOLN
10.0000 mL | Freq: Once | INTRAVENOUS | Status: AC | PRN
  Administered 2022-03-25: 10 mL via INTRAVENOUS

## 2022-03-27 ENCOUNTER — Ambulatory Visit: Payer: Commercial Managed Care - PPO | Admitting: Infectious Diseases

## 2022-04-03 ENCOUNTER — Other Ambulatory Visit: Payer: Self-pay | Admitting: Infectious Diseases

## 2022-04-03 DIAGNOSIS — Z8619 Personal history of other infectious and parasitic diseases: Secondary | ICD-10-CM

## 2022-04-03 DIAGNOSIS — R413 Other amnesia: Secondary | ICD-10-CM

## 2022-04-03 DIAGNOSIS — A530 Latent syphilis, unspecified as early or late: Secondary | ICD-10-CM

## 2022-04-04 ENCOUNTER — Ambulatory Visit
Admission: RE | Admit: 2022-04-04 | Discharge: 2022-04-04 | Disposition: A | Discharge status: Home / Self Care | Payer: Commercial Managed Care - PPO | Source: Ambulatory Visit | Attending: Infectious Diseases | Admitting: Infectious Diseases

## 2022-04-04 DIAGNOSIS — R413 Other amnesia: Secondary | ICD-10-CM | POA: Diagnosis not present

## 2022-04-04 DIAGNOSIS — Z8619 Personal history of other infectious and parasitic diseases: Secondary | ICD-10-CM | POA: Insufficient documentation

## 2022-04-04 DIAGNOSIS — A530 Latent syphilis, unspecified as early or late: Secondary | ICD-10-CM | POA: Insufficient documentation

## 2022-04-04 LAB — PROTEIN, CSF: Total  Protein, CSF: 20 mg/dL (ref 15–45)

## 2022-04-04 LAB — CSF CELL COUNT WITH DIFFERENTIAL
Eosinophils, CSF: 0 %
Lymphs, CSF: 95 %
Monocyte-Macrophage-Spinal Fluid: 5 %
RBC Count, CSF: 0 /mm3 (ref 0–3)
Segmented Neutrophils-CSF: 0 %
Tube #: 3
WBC, CSF: 5 /mm3 (ref 0–5)

## 2022-04-04 LAB — GLUCOSE, CSF: Glucose, CSF: 55 mg/dL (ref 40–70)

## 2022-04-04 MED ORDER — ACETAMINOPHEN 325 MG PO TABS
650.0000 mg | ORAL_TABLET | ORAL | Status: DC | PRN
  Administered 2022-04-04: 650 mg via ORAL
  Filled 2022-04-04 (×2): qty 2

## 2022-04-04 MED ORDER — ACETAMINOPHEN 325 MG PO TABS
ORAL_TABLET | ORAL | Status: AC
  Filled 2022-04-04: qty 2

## 2022-04-04 NOTE — Procedures (Signed)
Technically successful fluoro guided LP at L4-5 level with opening pressure of 14 cm H2O   8 cc of clear, colorless CSF sent to lab for analysis.  No immediate post procedural complication.  Please see imaging section of Epic for full dictation.  Lynnette Caffey, PA-C

## 2022-04-04 NOTE — Progress Notes (Signed)
Patient s/p uncomplicated LP this morning, asked to see patient due to patient reporting 6/10 low back pain at LP puncture site as well as LLE heaviness, pain and numbness. Per patient this started about 15 minutes post procedure and "feels like electricity that makes me jump." She denies post procedure HA, saddle paresthesias, loss of bowel/bladder control, vision changes. She did not feel these sensations during procedure.  On exam patient intermittently jumping and grimacing from pain - she points to her low back and left hip as main areas of pain but also states that she has pain/numbness down to her toes.  LP puncture site without drainage, bleeding, swelling or significant tenderness to palpation. Patient A&Ox3, asking for something to eat and drink Full symmetrical strength BLE, pain worsens slightly in low back when bringing knees to stomach Sensation in tact BLE, able to wiggle toes appropriately.   Patient seen and examined by myself and Dr. Armandina Gemma after initial complaint and then again about 30 minutes later. Neuro exam remains unchanged at follow up, patient reports feeling better after tylenol and rest. Still having intermittent pain but milder and less frequent now.   Likely nerve root irritation 2/2 LP -- discussed with patient this should improve with time and rest. Reviewed return precautions to which patient states understanding, patient's husband to drive her home and will be with her for the remainder of today.  Plan for d/c at 11 am if patient sx continue to improve.  Candiss Norse, PA-C

## 2022-04-09 LAB — VDRL, CSF: VDRL Quant, CSF: NONREACTIVE

## 2022-04-11 ENCOUNTER — Telehealth: Payer: Self-pay

## 2022-04-14 ENCOUNTER — Telehealth: Payer: Self-pay

## 2022-04-14 NOTE — Telephone Encounter (Signed)
Received call back from pt. Pt states she can come in this Thursday 04/17/22 (declined earlier appt times offered). Provided address information for [facility]. Pt states she was advised by her dr that she needs tx every week for 3 weeks.  Appt at [facility] 04/17/22  (04/03/22 [facility] clinic notes indicate they contacted [facility] and it was reported no prior + syphilis for this pt. No syphilis testing prior to 2023 found in Epic or state labs. This RN does not have access to syphilis results in [facility].)

## 2022-04-14 NOTE — Telephone Encounter (Signed)
Calling pt regarding positive syphilis results. Per Dr. Jarrett Ables documentation dated 04/03/22, pt needs bicillin x3 ([facility] provider to review and write orders). Some labs appear under Care Everywhere: 03/03/22 RPR Reactive; Quant 1:8 03/14/22 Treponema pallidum antibodies Reactive  Phone call to pt at [phone]. Left message on Pim's voicemail to call Omara Alcon, RN with [facility], to schedule tx/medication appt, at [phone].

## 2022-04-14 NOTE — Telephone Encounter (Signed)
Message from Dr. 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   [facility] RN will contact patient to schedule appt.

## 2022-04-17 ENCOUNTER — Encounter: Payer: Self-pay | Admitting: Family Medicine

## 2022-04-17 ENCOUNTER — Ambulatory Visit: Payer: Self-pay | Admitting: Family Medicine

## 2022-04-17 DIAGNOSIS — R413 Other amnesia: Secondary | ICD-10-CM

## 2022-04-17 DIAGNOSIS — A539 Syphilis, unspecified: Secondary | ICD-10-CM

## 2022-04-17 DIAGNOSIS — Z113 Encounter for screening for infections with a predominantly sexual mode of transmission: Secondary | ICD-10-CM

## 2022-04-17 MED ORDER — PENICILLIN G BENZATHINE 1200000 UNIT/2ML IM SUSY
2.4000 10*6.[IU] | PREFILLED_SYRINGE | INTRAMUSCULAR | Status: AC
  Administered 2022-04-17 – 2022-05-01 (×3): 2.4 10*6.[IU] via INTRAMUSCULAR
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
## 2022-04-17 NOTE — Progress Notes (Signed)
Pt here for 1st syphilis treatment. Labwork completed and treatment given. Pt walks hall for 15 min without complication. To front desk to make appointment for one week for treatment #2.  Keitha Butte RN

## 2022-04-23 NOTE — Telephone Encounter (Addendum)
Per Wendi Snipes, FNP, pt will be ordered tx x3.  Pt has had traumatic brain injury in past and hx is unknown prior to injury.  #1 = completed on 04/17/22 #2 = (appt scheduled for 04/24/22) - completed 2nd set of injections 04/24/22 #3 = (appt scheduled for 05/01/22)

## 2022-04-24 ENCOUNTER — Ambulatory Visit: Payer: Self-pay | Admitting: Nurse Practitioner

## 2022-04-24 ENCOUNTER — Encounter: Payer: Self-pay | Admitting: Nurse Practitioner

## 2022-04-24 DIAGNOSIS — A539 Syphilis, unspecified: Secondary | ICD-10-CM

## 2022-04-24 NOTE — Progress Notes (Signed)
60 year old female in clinic today for treatment for Syphilis.  Patient had an STD visit on 04/17/22.  She was referred by her ID doctor after ruling out Neurosyphilis.  RPR on 04/17/22 was 1:16.  Per Dr. Jarrett Ables recommendations 3 doses of Bicillin is recommended.   Patient will need Bicillin 2.4 million units IM  x 3 dose. First dose given on 04/17/22, patient here today for second dose.  Third dose due in one week.  Glenna Fellows, FNP   1. Syphilis - penicillin g benzathine (BICILLIN LA) 1200000 UNIT/2ML injection 2.4 Million Units

## 2022-04-29 ENCOUNTER — Ambulatory Visit: Payer: Commercial Managed Care - PPO | Admitting: Infectious Diseases

## 2022-05-01 ENCOUNTER — Ambulatory Visit: Payer: Self-pay

## 2022-05-01 DIAGNOSIS — A539 Syphilis, unspecified: Secondary | ICD-10-CM

## 2022-05-01 NOTE — Progress Notes (Signed)
In nurse clinic for Syphilis tx Bicillin #3. Says she did fine with first 2 doses.  Per Glenna Fellows FNP note 04/22/22  "per Dr. Sampson Goon, pt needs 3 doses Bicillin 2.4 mu IM".  Administered IM in LUOQ and RUOQ; tolerated well and walked in hallway 20 minutes after given.   Instructed to return in 6 months for repeat RPR (around 10/31/22); appt reminder given.  Cherlynn Polo, RN

## 2023-02-04 ENCOUNTER — Other Ambulatory Visit: Payer: Self-pay | Admitting: Obstetrics and Gynecology

## 2023-02-04 DIAGNOSIS — Z1231 Encounter for screening mammogram for malignant neoplasm of breast: Secondary | ICD-10-CM

## 2023-03-05 ENCOUNTER — Ambulatory Visit
Admission: RE | Admit: 2023-03-05 | Discharge: 2023-03-05 | Disposition: A | Discharge status: Home / Self Care | Payer: Commercial Managed Care - PPO | Source: Ambulatory Visit | Attending: Obstetrics and Gynecology | Admitting: Obstetrics and Gynecology

## 2023-03-05 DIAGNOSIS — Z1231 Encounter for screening mammogram for malignant neoplasm of breast: Secondary | ICD-10-CM | POA: Diagnosis not present

## 2024-02-04 ENCOUNTER — Other Ambulatory Visit: Payer: Self-pay | Admitting: Obstetrics and Gynecology

## 2024-02-04 DIAGNOSIS — Z1231 Encounter for screening mammogram for malignant neoplasm of breast: Secondary | ICD-10-CM

## 2024-03-07 ENCOUNTER — Ambulatory Visit
Admission: RE | Admit: 2024-03-07 | Discharge: 2024-03-07 | Disposition: A | Discharge status: Home / Self Care | Source: Ambulatory Visit | Attending: Obstetrics and Gynecology | Admitting: Obstetrics and Gynecology

## 2024-03-07 DIAGNOSIS — Z1231 Encounter for screening mammogram for malignant neoplasm of breast: Secondary | ICD-10-CM | POA: Diagnosis present
